# Patient Record
Sex: Male | Born: 1990 | Race: Black or African American | Hispanic: No | Marital: Single | Smoking: Former smoker
Health system: Southern US, Community
[De-identification: ages and names within clinical notes are randomized; demographics above are authoritative.]

## PROBLEM LIST (undated history)

## (undated) HISTORY — PX: HERNIA REPAIR: SHX51

## (undated) HISTORY — PX: ANKLE SURGERY: SHX546

---
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
## 2010-07-08 ENCOUNTER — Emergency Department (HOSPITAL_COMMUNITY): Payer: Self-pay

## 2010-07-08 ENCOUNTER — Emergency Department (HOSPITAL_COMMUNITY)
Admission: EM | Admit: 2010-07-08 | Discharge: 2010-07-09 | Disposition: A | Discharge status: Home / Self Care | Payer: No Typology Code available for payment source | Attending: General Surgery | Admitting: General Surgery

## 2010-07-08 DIAGNOSIS — M79609 Pain in unspecified limb: Secondary | ICD-10-CM | POA: Insufficient documentation

## 2010-07-08 DIAGNOSIS — M25519 Pain in unspecified shoulder: Secondary | ICD-10-CM | POA: Insufficient documentation

## 2010-07-08 DIAGNOSIS — S51809A Unspecified open wound of unspecified forearm, initial encounter: Secondary | ICD-10-CM | POA: Insufficient documentation

## 2010-07-08 DIAGNOSIS — IMO0002 Reserved for concepts with insufficient information to code with codable children: Secondary | ICD-10-CM | POA: Insufficient documentation

## 2010-07-08 DIAGNOSIS — M25529 Pain in unspecified elbow: Secondary | ICD-10-CM | POA: Insufficient documentation
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
## 2013-05-21 ENCOUNTER — Emergency Department (HOSPITAL_COMMUNITY)
Admission: EM | Admit: 2013-05-21 | Discharge: 2013-05-22 | Disposition: A | Discharge status: Home / Self Care | Payer: No Typology Code available for payment source | Attending: Emergency Medicine | Admitting: Emergency Medicine

## 2013-05-21 ENCOUNTER — Encounter (HOSPITAL_COMMUNITY): Payer: Self-pay | Admitting: Emergency Medicine

## 2013-05-21 DIAGNOSIS — Z87891 Personal history of nicotine dependence: Secondary | ICD-10-CM | POA: Insufficient documentation

## 2013-05-21 DIAGNOSIS — S61409A Unspecified open wound of unspecified hand, initial encounter: Secondary | ICD-10-CM | POA: Insufficient documentation

## 2013-05-21 DIAGNOSIS — Z23 Encounter for immunization: Secondary | ICD-10-CM | POA: Insufficient documentation

## 2013-05-21 DIAGNOSIS — S61411A Laceration without foreign body of right hand, initial encounter: Secondary | ICD-10-CM
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
## 2013-05-22 MED ORDER — TETANUS-DIPHTH-ACELL PERTUSSIS 5-2.5-18.5 LF-MCG/0.5 IM SUSP
0.5000 mL | Freq: Once | INTRAMUSCULAR | Status: AC
  Administered 2013-05-22: 0.5 mL via INTRAMUSCULAR

## 2013-05-22 NOTE — ED Provider Notes (Signed)
Medical screening examination/treatment/procedure(s) were performed by non-physician practitioner and as supervising physician I was immediately available for consultation/collaboration.   Bernal Luhman, MD 05/22/13 0722 

## 2013-05-22 NOTE — Discharge Instructions (Signed)
1. Medications: usual home medications 2. Treatment: rest, drink plenty of fluids, keep wound clean and bandage dry 3. Follow Up: Please followup with your primary doctor for discussion of your diagnoses and further evaluation after today's visit; if you do not have a primary care doctor use the resource guide provided to find one;     Laceration Care, Adult A laceration is a cut or lesion that goes through all layers of the skin and into the tissue just beneath the skin. TREATMENT  Some lacerations may not require closure. Some lacerations may not be able to be closed due to an increased risk of infection. It is important to see your caregiver as soon as possible after an injury to minimize the risk of infection and maximize the opportunity for successful closure. If closure is appropriate, pain medicines may be given, if needed. The wound will be cleaned to help prevent infection. Your caregiver will use stitches (sutures), staples, wound glue (adhesive), or skin adhesive strips to repair the laceration. These tools bring the skin edges together to allow for faster healing and a better cosmetic outcome. However, all wounds will heal with a scar. Once the wound has healed, scarring can be minimized by covering the wound with sunscreen during the day for 1 full year. HOME CARE INSTRUCTIONS  For sutures or staples:  Keep the wound clean and dry.  If you were given a bandage (dressing), you should change it at least once a day. Also, change the dressing if it becomes wet or dirty, or as directed by your caregiver.  Wash the wound with soap and water 2 times a day. Rinse the wound off with water to remove all soap. Pat the wound dry with a clean towel.  After cleaning, apply a thin layer of the antibiotic ointment as recommended by your caregiver. This will help prevent infection and keep the dressing from sticking.  You may shower as usual after the first 24 hours. Do not soak the wound in water  until the sutures are removed.  Only take over-the-counter or prescription medicines for pain, discomfort, or fever as directed by your caregiver.  Get your sutures or staples removed as directed by your caregiver. For skin adhesive strips:  Keep the wound clean and dry.  Do not get the skin adhesive strips wet. You may bathe carefully, using caution to keep the wound dry.  If the wound gets wet, pat it dry with a clean towel.  Skin adhesive strips will fall off on their own. You may trim the strips as the wound heals. Do not remove skin adhesive strips that are still stuck to the wound. They will fall off in time. For wound adhesive:  You may briefly wet your wound in the shower or bath. Do not soak or scrub the wound. Do not swim. Avoid periods of heavy perspiration until the skin adhesive has fallen off on its own. After showering or bathing, gently pat the wound dry with a clean towel.  Do not apply liquid medicine, cream medicine, or ointment medicine to your wound while the skin adhesive is in place. This may loosen the film before your wound is healed.  If a dressing is placed over the wound, be careful not to apply tape directly over the skin adhesive. This may cause the adhesive to be pulled off before the wound is healed.  Avoid prolonged exposure to sunlight or tanning lamps while the skin adhesive is in place. Exposure to ultraviolet light in  the first year will darken the scar.  The skin adhesive will usually remain in place for 5 to 10 days, then naturally fall off the skin. Do not pick at the adhesive film. You may need a tetanus shot if:  You cannot remember when you had your last tetanus shot.  You have never had a tetanus shot. If you get a tetanus shot, your arm may swell, get red, and feel warm to the touch. This is common and not a problem. If you need a tetanus shot and you choose not to have one, there is a rare chance of getting tetanus. Sickness from tetanus can  be serious. SEEK MEDICAL CARE IF:   You have redness, swelling, or increasing pain in the wound.  You see a red line that goes away from the wound.  You have yellowish-white fluid (pus) coming from the wound.  You have a fever.  You notice a bad smell coming from the wound or dressing.  Your wound breaks open before or after sutures have been removed.  You notice something coming out of the wound such as wood or glass.  Your wound is on your hand or foot and you cannot move a finger or toe. SEEK IMMEDIATE MEDICAL CARE IF:   Your pain is not controlled with prescribed medicine.  You have severe swelling around the wound causing pain and numbness or a change in color in your arm, hand, leg, or foot.  Your wound splits open and starts bleeding.  You have worsening numbness, weakness, or loss of function of any joint around or beyond the wound.  You develop painful lumps near the wound or on the skin anywhere on your body. MAKE SURE YOU:   Understand these instructions.  Will watch your condition.  Will get help right away if you are not doing well or get worse. Document Released: 12/31/2004 Document Revised: 03/25/2011 Document Reviewed: 06/26/2010 [facility] Patient Information 2014 [place], [place].    Emergency Department Resource Guide 1) Find a Doctor and Pay Out of Pocket Although you won't have to find out who is covered by your insurance plan, it is a good idea to ask around and get recommendations. You will then need to call the office and see if the doctor you have chosen will accept you as a new patient and what types of options they offer for patients who are self-pay. Some doctors offer discounts or will set up payment plans for their patients who do not have insurance, but you will need to ask so you aren't surprised when you get to your appointment.  2) Contact Your Local Health Department Not all health departments have doctors that can see patients for sick  visits, but many do, so it is worth a call to see if yours does. If you don't know where your local health department is, you can check in your phone book. The CDC also has a tool to help you locate your state's health department, and many state websites also have listings of all of their local health departments.  3) Find a Walk-in Clinic If your illness is not likely to be very severe or complicated, you may want to try a walk in clinic. These are popping up all over the country in pharmacies, drugstores, and shopping centers. They're usually staffed by nurse practitioners or physician assistants that have been trained to treat common illnesses and complaints. They're usually fairly quick and inexpensive. However, if you have serious medical issues or chronic  medical problems, these are probably not your best option.  No Primary Care Doctor: - Call Health Connect at  [phone] - they can help you locate a primary care doctor that  accepts your insurance, provides certain services, etc. - Physician Referral Service- [phone]  Chronic Pain Problems: Organization         Address  Phone   Notes  [facility]  [phone] Patients need to be referred by their primary care doctor.   Medication Assistance: Organization         Address  Phone   Notes  [facility] Medication [facility] [address] [place] [place], [place] [address] [phone] --Must be a resident of [facility] -- Must have NO insurance coverage whatsoever (no Medicaid/ Medicare, etc.) -- The pt. MUST have a primary care doctor that directs their care regularly and follows them in the community   [facility]  [phone]   [employer]  [phone]    Agencies that provide inexpensive medical care: Organization         Address  Phone   Notes  [facility]  [phone]   [facility]    [phone]   [facility] [address] [place], [place] [address] [phone]   [facility] [address] [place], [place] [phone]   Planned Parenthood    [phone]   [facility]    [phone]   [facility]  [address], [place] Phone:  [phone], Fax:  [phone] Hours of Operation:  9 am - 6 pm, M-F.  Also accepts Medicaid/Medicare and self-pay.  [facility]  [address], [place] Phone: [phone], Fax: [phone]. Hours of Operation:  8:30 am - 5:30 pm, M-F.  Also accepts Medicaid and self-pay.  [facility] [place] [address] [place] Phone: [phone]   [facility] [address] Natasha Bence [place], [place] [phone], Ext. [phone] Mondays & Thursdays: 7-9 AM.  First 15 patients are seen on a first come, first serve basis.    Medicaid-accepting [facility] Providers:  Organization         Address  Phone   Notes  [facility] [address], [place] [phone] Also accepts self-pay patients.  [facility] [address] Laurell Josephs [place], [place]  [phone]   [facility] [address], [place] [phone]   [facility] [address] [place] [phone]   Renaye Rakers [address], [place]   [phone] Only accepts [place] Access [place] patients after they have their name applied to their card.   Self-Pay (no insurance) in [facility]:  Organization         Address  Phone   Notes  Sickle Cell Patients, [facility] [address] [place], [place] [phone]   [facility] [address] [place], [place] [phone]   [facility] [place]  [address], [place] [phone]   [facility]/Dr. Osei-Bonsu  [address],  [place] or [address], [place] [phone] Phone number for both [place] and [place] locations is the same.  Urgent  Medical [facility] [address], [place] [phone]   [facility] [place] [address] [place] [address], [place] or [address] [phone]   [facility] [address], [place] [phone], phone; [phone], fax Sees patients 1st and 3rd Saturday of every month.  Must not qualify for public or private insurance (i.e. Medicaid, Medicare, [place] Health Choice, Veterans' Benefits)  Household income should be no more than 200% of the poverty level The clinic cannot treat you if you are pregnant or think you are pregnant  Sexually transmitted diseases are not treated at the clinic.    Dental Care: Organization         Address  Phone  Notes  [facility] [address], [place] [phone] Accepts children up to age 23 who are enrolled in [place] or [place] Health Choice; pregnant women with a Medicaid card; and children who have applied for Medicaid or [place] Health Choice, but were declined, whose parents can pay a reduced fee at time of service.  [facility] [place]  [address], [place] [phone] Accepts children up to age 23 who are enrolled in [place] or [place] Health Choice; pregnant women with a Medicaid card; and children who have applied for Medicaid or [place] Health Choice, but were declined, whose parents can pay a reduced fee at time of service.  [facility]  [address], [place] [phone] Patients are seen by appointment only. Walk-ins are not accepted. [facility] will see patients 23 years of age and older. Monday - Tuesday (8am-5pm) Most Wednesdays (8:30-5pm) $30 per visit, cash only  [facility]  [address], [facility] [place] [phone] Patients are seen by appointment only. Walk-ins are not accepted. [facility] will see patients 23 years of age and older. One Wednesday Evening (Monthly: Volunteer Based).  $30 per visit, cash only  [employer] [facility] [facility]  [phone] for adults; Children under age 234, call Graduate Pediatric Dentistry at [phone]. Children aged 454-14, please call [phone] to request a pediatric application.  Dental services are provided in all areas of dental care including fillings, crowns and bridges, complete and partial dentures, implants, gum treatment, root canals, and extractions. Preventive care is also provided. Treatment is provided to both adults and children. Patients are selected via a lottery and there is often a waiting list.   [facility] [address], [place]  [phone] [email or web address]   [facility] [address], [place], [place] [phone], Ext. [phone] Second and Fourth Thursday of each month, opens at 6:30 AM; Clinic ends at 9 AM.  Patients are seen on a first-come first-served basis, and a limited number are seen during each clinic.   [facility]  [address], [place], [place] [phone]   Eligibility Requirements You must have lived in [place], [place], or [place] counties for at least the last three months.   You cannot be eligible for state or federal sponsored National Cityhealthcare insurance, including CIGNAVeterans Administration, [place]icaid, or [employer]icare.   You generally cannot be eligible for healthcare insurance through your employer.    How to apply: Eligibility screenings are held every Tuesday and Wednesday afternoon from 1:00 pm until 4:00 pm. You do not need an appointment for the interview!  [place]  [facility] [address] [place], [place] [phone]   [facility] Health Department  [phone]   [facility] Health Department  [phone]   [facility] Health Department  [phone]    Behavioral Health Resources in the Community: Intensive Outpatient Programs Organization         Address  Phone  Notes  [facility] Services [address], [place], [place] [phone]   [facility] Outpatient [address] [place], [place] [phone]   [facility] [address], [place], [place]  [phone]   [facility] [address]  [place], [place] [phone] or [phone]   Substance Abuse Resources Organization         Address  Phone  Notes  [facility]  [phone]   [facility]  [phone]   [facility] [place]  [phone]   Floydene Flock  [phone]   Residential & Outpatient Substance Abuse Program  [phone]   Psychological Services Organization         Address  Phone  Notes  [facility] Behavioral Health  [phone]   [facility] Services  [phone]   [facility] Mental Health [address] [place] [phone] or [phone]    Mobile Crisis Teams Organization         Address  Phone  Notes  [facility], Mobile Crisis Care Unit  [phone]   [facility]  [address] [place], [place] [phone]   Doristine Locks [address] [place] [phone]    Self-Help/Support Groups Organization         Address  Phone             Notes  [facility] [place] - variety of support groups  336- I7437963 Call for more information  Narcotics Anonymous (NA), [facility] [address], [employer] [place]  2 meetings at this location   Statistician         Address  Phone  Notes  [facility] [address],    [place]  [phone]   [facility]  [address], [place] [address], [place], [place] [phone]   [facility] [address] [place], [place]  [phone] Admissions: 8am-3pm M-F  [facility] [address],    [place], [place] [phone]   [facility] [address] [place], [place], [place] [phone]   [facility] [address],  [place], [place] [phone]   [facility] - Intensive Outpatient [address] Laurell Josephs [address], [place], [place] [phone]   [facility] [address]  [place], [place] [phone] or [phone]   [facility] ([facility]) [address], [place], [place] [phone] Accepts Medicaid  [facility] [address]  [place] [phone] Substance Abuse/Addiction Treatment   [facility] Organization         Address  Phone  Notes  [facility]  [phone]   Angie Fava, PhD [address] Ervin Knack [place], [place]   [phone] or [phone]   [facility]al   [address] [place], [place] [phone]   [facility] [address] [place], [place] [phone] Insurance/Medicaid/sponsorship through [employer] and Families [address]  [place], [place] [phone] [place], [place] [phone]    Dr. Adele Schilder  [phone]   [facility] Dept. 1) [address], [place] 2) [place] 3)  [address], [place] [phone]  [phone]   [place] [phone] or [phone] (After Hours)

## 2013-10-23 ENCOUNTER — Emergency Department (HOSPITAL_COMMUNITY)
Admission: EM | Admit: 2013-10-23 | Discharge: 2013-10-23 | Discharge status: Against Medical Advice | Payer: No Typology Code available for payment source | Attending: Emergency Medicine | Admitting: Emergency Medicine

## 2013-10-23 ENCOUNTER — Encounter (HOSPITAL_COMMUNITY): Payer: Self-pay | Admitting: Emergency Medicine

## 2013-10-23 DIAGNOSIS — R1110 Vomiting, unspecified: Secondary | ICD-10-CM | POA: Insufficient documentation

## 2013-10-23 LAB — COMPREHENSIVE METABOLIC PANEL
ALT: 35 U/L (ref 0–53)
ANION GAP: 23 — AB (ref 5–15)
AST: 45 U/L — ABNORMAL HIGH (ref 0–37)
Albumin: 4.8 g/dL (ref 3.5–5.2)
Alkaline Phosphatase: 52 U/L (ref 39–117)
BUN: 20 mg/dL (ref 6–23)
CALCIUM: 9.8 mg/dL (ref 8.4–10.5)
CO2: 19 mEq/L (ref 19–32)
Chloride: 97 mEq/L (ref 96–112)
Creatinine, Ser: 0.96 mg/dL (ref 0.50–1.35)
GFR calc non Af Amer: >90 mL/min (ref >90)
GLUCOSE: 113 mg/dL — AB (ref 70–99)
Potassium: 4.2 mEq/L (ref 3.7–5.3)
SODIUM: 139 meq/L (ref 137–147)
TOTAL PROTEIN: 8.4 g/dL — AB (ref 6.0–8.3)
Total Bilirubin: 1.1 mg/dL (ref 0.3–1.2)

## 2013-10-23 LAB — CBC WITH DIFFERENTIAL/PLATELET
Basophils Absolute: 0 10*3/uL (ref 0.0–0.1)
Basophils Relative: 0 % (ref 0–1)
EOS ABS: 0 10*3/uL (ref 0.0–0.7)
EOS PCT: 0 % (ref 0–5)
HCT: 44.9 % (ref 39.0–52.0)
Hemoglobin: 16.2 g/dL (ref 13.0–17.0)
LYMPHS ABS: 1.1 10*3/uL (ref 0.7–4.0)
Lymphocytes Relative: 7 % — ABNORMAL LOW (ref 12–46)
MCH: 28.9 pg (ref 26.0–34.0)
MCHC: 36.1 g/dL — AB (ref 30.0–36.0)
MCV: 80 fL (ref 78.0–100.0)
Monocytes Absolute: 0.4 10*3/uL (ref 0.1–1.0)
Monocytes Relative: 3 % (ref 3–12)
Neutro Abs: 15.4 10*3/uL — ABNORMAL HIGH (ref 1.7–7.7)
Neutrophils Relative %: 90 % — ABNORMAL HIGH (ref 43–77)
PLATELETS: 236 10*3/uL (ref 150–400)
RBC: 5.61 MIL/uL (ref 4.22–5.81)
RDW: 13.4 % (ref 11.5–15.5)
WBC: 17 10*3/uL — ABNORMAL HIGH (ref 4.0–10.5)
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
## 2014-03-06 ENCOUNTER — Emergency Department (HOSPITAL_COMMUNITY): Payer: Self-pay

## 2014-03-06 ENCOUNTER — Encounter (HOSPITAL_COMMUNITY): Payer: Self-pay | Admitting: Emergency Medicine

## 2014-03-06 ENCOUNTER — Emergency Department (HOSPITAL_COMMUNITY)
Admission: EM | Admit: 2014-03-06 | Discharge: 2014-03-06 | Disposition: A | Discharge status: Home / Self Care | Payer: Self-pay | Attending: Emergency Medicine | Admitting: Emergency Medicine

## 2014-03-06 DIAGNOSIS — Y9289 Other specified places as the place of occurrence of the external cause: Secondary | ICD-10-CM | POA: Insufficient documentation

## 2014-03-06 DIAGNOSIS — W208XXA Other cause of strike by thrown, projected or falling object, initial encounter: Secondary | ICD-10-CM | POA: Insufficient documentation

## 2014-03-06 DIAGNOSIS — IMO0002 Reserved for concepts with insufficient information to code with codable children: Secondary | ICD-10-CM

## 2014-03-06 DIAGNOSIS — Y990 Civilian activity done for income or pay: Secondary | ICD-10-CM | POA: Insufficient documentation

## 2014-03-06 DIAGNOSIS — Y9389 Activity, other specified: Secondary | ICD-10-CM | POA: Insufficient documentation

## 2014-03-06 DIAGNOSIS — Z87891 Personal history of nicotine dependence: Secondary | ICD-10-CM | POA: Insufficient documentation

## 2014-03-06 DIAGNOSIS — S61212A Laceration without foreign body of right middle finger without damage to nail, initial encounter: Secondary | ICD-10-CM | POA: Insufficient documentation

## 2014-03-06 MED ORDER — LIDOCAINE HCL (PF) 1 % IJ SOLN
5.0000 mL | Freq: Once | INTRAMUSCULAR | Status: AC
  Administered 2014-03-06: 5 mL via INTRADERMAL
  Filled 2014-03-06: qty 5

## 2014-03-06 MED ORDER — OXYCODONE HCL 5 MG PO TABS
5.0000 mg | ORAL_TABLET | ORAL | Status: AC
  Administered 2014-03-06: 5 mg via ORAL
  Filled 2014-03-06: qty 1

## 2014-03-06 MED ORDER — OXYCODONE HCL 5 MG PO TABS: 5.0000 mg | ORAL_TABLET | ORAL | Status: AC | PRN

## 2014-03-06 NOTE — ED Notes (Signed)
PA at bedside.

## 2014-03-06 NOTE — Discharge Instructions (Signed)
Please follow the directions provided.  Be sure to return in 7 days for suture removal.  Keep your wound clean and dry.  Change your dressing daily using antibiotic ointment.  Return sooner for any signs of infection including redness, swelling, warmth or drainage.  Don't hesitate to return for any new, worsening or concerning symptoms.      SEEK IMMEDIATE MEDICAL CARE IF:  You have severe swelling around the wound causing pain and numbness or a change in color in your arm, hand, leg, or foot.  Your wound splits open and starts bleeding.  You have worsening numbness, weakness, or loss of function of any joint around or beyond the wound.  You develop painful lumps near the wound or on the skin anywhere on your body.

## 2014-03-06 NOTE — ED Provider Notes (Signed)
CSN: [id number]     Arrival date & time 03/06/14  1033 History   First MD Initiated Contact with Patient 03/06/14 1042     Chief Complaint  Patient presents with  . Laceration   (Consider location/radiation/quality/duration/timing/severity/associated sxs/prior Treatment) HPI  Joe Reeves is a 24 yo male presenting with injury to his right middle finger.  He states he was at work and dropped a Dispensing optician on it appr 30 min PTA, resulting in a laceration around his finger.  He estimates the board weighs about 100lb.  He describes the pain as aching and rates it as 9/10. He denies any difficulty moving the finger or any altered sensation. His tetanus shot is UTD.    History reviewed. No pertinent past medical history. Past Surgical History  Procedure Laterality Date  . Hernia repair     No family history on file. History  Substance Use Topics  . Smoking status: Former Games developer  . Smokeless tobacco: Not on file  . Alcohol Use: Yes    Review of Systems  Musculoskeletal: Positive for myalgias and arthralgias.  Skin: Positive for wound. Negative for color change.  Neurological: Negative for weakness and numbness.    Allergies  Tylenol  Home Medications   Prior to Admission medications   Not on File   BP 132/78 mmHg  Pulse 70  Temp(Src) 98 F (36.7 C) (Oral)  Resp 16  Ht  (1.778 m)  Wt 180 lb (81.647 kg)  BMI 25.83 kg/m2  SpO2 99% Physical Exam  Constitutional: He appears well-developed and well-nourished. No distress.  Eyes: Conjunctivae are normal.  Cardiovascular: Normal rate.   Pulmonary/Chest: Effort normal.  Musculoskeletal: He exhibits tenderness.       Right hand: He exhibits tenderness and laceration. He exhibits normal range of motion, normal two-point discrimination and normal capillary refill. Normal sensation noted. Normal strength noted.       Hands: 5/5 strength with flexion and extension, bleeding controlled  Neurological: He is alert.    Skin: Skin is warm. He is not diaphoretic.  Nursing note and vitals reviewed.   ED Course  Procedures (including critical care time) LACERATION REPAIR Performed by: Harle Battiest Authorized by: Harle Battiest Consent: Verbal consent obtained. Risks and benefits: risks, benefits and alternatives were discussed Consent given by: patient Patient identity confirmed: provided demographic data Prepped and Draped in normal sterile fashion Wound explored  Laceration Location: right middle finger, dorsal  Laceration Length: 2 cm  No Foreign Bodies seen or palpated  Anesthesia: local infiltration  Local anesthetic: lidocaine 1 % without epinephrine  Anesthetic total: 2 ml  Irrigation method: syringe Amount of cleaning: standard  Skin closure: 5-0 Prolene  Number of sutures: 4  Technique: simple interrupted  Patient tolerance: Patient tolerated the procedure well with no immediate complications.  LACERATION REPAIR Performed by: Harle Battiest Authorized by: Harle Battiest Consent: Verbal consent obtained. Risks and benefits: risks, benefits and alternatives were discussed Consent given by: patient Patient identity confirmed: provided demographic data Prepped and Draped in normal sterile fashion Wound explored  Laceration Location: right middle finger, palmar   Laceration Length: 2 cm  No Foreign Bodies seen or palpated  Anesthesia: local infiltration  Local anesthetic: lidocaine 1 % without epinephrine  Anesthetic total: 2 ml  Irrigation method: syringe Amount of cleaning: standard  Skin closure: 5-0 prolene  Number of sutures: 2  Technique: simple interrupted  Patient tolerance: Patient tolerated the procedure well with no immediate complications.    Labs  Review Labs Reviewed - No data to display  Imaging Review Dg Finger Middle Right  03/06/2014   CLINICAL DATA:  Right middle finger injury, injury, laceration  EXAM: RIGHT  MIDDLE FINGER 2+V  COMPARISON:  None.  FINDINGS: Soft tissue injury noted compatible with laceration. Mild swelling. Normal alignment without acute fracture. Preserved joint spaces. No significant arthropathy. No radiopaque foreign body.  IMPRESSION: Soft tissue injury/ laceration.  No acute osseous finding.   Electronically Signed   By: Judie PetitM.  Shick M.D.   On: 03/06/2014 11:34     EKG Interpretation None      MDM   Final diagnoses:  Laceration   24 yo with laceration that occurred less than 2 hours PTA. Pressure irrigation performed. Tdap is UTD, X-ray is negative for fracture and foreign body. Repair performed and wound care discussed with pt.  Disscussed suture home care w pt and answered questions. Instructed pt to return for suture removal in 7 days. Pt is well-appearing, in no acute distress and vital signs reviewed and not concerning.  They appear safe to be discharged.  Return precautions provided. Pt aware of plan and in agreement.      Filed Vitals:   03/06/14 1046 03/06/14 1136 03/06/14 1137 03/06/14 1233  BP:  128/75  122/77  Pulse:   70 65  Temp:      TempSrc:      Resp:  16  16  Height: 5\' 10"  (1.778 m)     Weight: 180 lb (81.647 kg)     SpO2:   100% 99%   Meds given in ED:  Medications  lidocaine (PF) (XYLOCAINE) 1 % injection 5 mL (5 mLs Intradermal Given by Other 03/06/14 1138)  oxyCODONE (Oxy IR/ROXICODONE) immediate release tablet 5 mg (5 mg Oral Given 03/06/14 1138)    Discharge Medication List as of 03/06/2014 12:26 PM    START taking these medications   Details  oxyCODONE (ROXICODONE) 5 MG immediate release tablet Take 1 tablet (5 mg total) by mouth every 4 (four) hours as needed for severe pain., Starting 03/06/2014, Until Discontinued, Print          Harle BattiestElizabeth Daleena Rotter, NP 03/07/14 1040  Suzi RootsKevin E Steinl, MD 03/11/14 [phone]

## 2014-03-06 NOTE — ED Notes (Signed)
Pt c/o lifting a scaffold board and it fell onto his right middle finger. Bleeding controlled at this time.

## 2014-03-06 NOTE — ED Notes (Signed)
Patient transported to X-ray 

## 2014-03-06 NOTE — ED Notes (Signed)
Wound care performed to R middle finger. Covered with sterile dressing and coban. Finger splint applied. Wound care instructions discussed with patient. Has no further questions at the time. Instructed to follow up in ED to have sutures removed in 5 days.

## 2014-03-06 NOTE — ED Notes (Signed)
PA Tysinger at bedside.

## 2014-07-14 ENCOUNTER — Emergency Department (HOSPITAL_COMMUNITY)
Admission: EM | Admit: 2014-07-14 | Discharge: 2014-07-14 | Disposition: A | Discharge status: Home / Self Care | Payer: Self-pay | Attending: Emergency Medicine | Admitting: Emergency Medicine

## 2014-07-14 ENCOUNTER — Encounter (HOSPITAL_COMMUNITY): Payer: Self-pay | Admitting: Emergency Medicine

## 2014-07-14 DIAGNOSIS — H6642 Suppurative otitis media, unspecified, left ear: Secondary | ICD-10-CM

## 2014-07-14 DIAGNOSIS — H66002 Acute suppurative otitis media without spontaneous rupture of ear drum, left ear: Secondary | ICD-10-CM | POA: Insufficient documentation

## 2014-07-14 DIAGNOSIS — Z87891 Personal history of nicotine dependence: Secondary | ICD-10-CM | POA: Insufficient documentation

## 2014-07-14 MED ORDER — AMOXICILLIN 500 MG PO CAPS: 500.0000 mg | ORAL_CAPSULE | Freq: Three times a day (TID) | ORAL | Status: AC

## 2014-07-14 MED ORDER — IBUPROFEN 600 MG PO TABS
600.0000 mg | ORAL_TABLET | Freq: Four times a day (QID) | ORAL | Status: DC | PRN
Start: 2014-07-14 — End: 2015-05-10

## 2014-07-14 NOTE — ED Notes (Signed)
Patient states started having L ear pain x 1 day ago.   Patient denies other symptoms.

## 2014-07-14 NOTE — Discharge Instructions (Signed)
Take Sudafed for congestion, and he can obtain this medication over-the-counter. Take ibuprofen or Tylenol for pain. Amoxicillin as prescribed until all gone for the infection. Follow-up with primary care doctor or return if symptoms are worsening   Otitis Media Otitis media is redness, soreness, and inflammation of the middle ear. Otitis media may be caused by allergies or, most commonly, by infection. Often it occurs as a complication of the common cold. SIGNS AND SYMPTOMS Symptoms of otitis media may include:  Earache.  Fever.  Ringing in your ear.  Headache.  Leakage of fluid from the ear. DIAGNOSIS To diagnose otitis media, your health care provider will examine your ear with an otoscope. This is an instrument that allows your health care provider to see into your ear in order to examine your eardrum. Your health care provider also will ask you questions about your symptoms. TREATMENT  Typically, otitis media resolves on its own within 3-5 days. Your health care provider may prescribe medicine to ease your symptoms of pain. If otitis media does not resolve within 5 days or is recurrent, your health care provider may prescribe antibiotic medicines if he or she suspects that a bacterial infection is the cause. HOME CARE INSTRUCTIONS   If you were prescribed an antibiotic medicine, finish it all even if you start to feel better.  Take medicines only as directed by your health care provider.  Keep all follow-up visits as directed by your health care provider. SEEK MEDICAL CARE IF:  You have otitis media only in one ear, or bleeding from your nose, or both.  You notice a lump on your neck.  You are not getting better in 3-5 days.  You feel worse instead of better. SEEK IMMEDIATE MEDICAL CARE IF:   You have pain that is not controlled with medicine.  You have swelling, redness, or pain around your ear or stiffness in your neck.  You notice that part of your face is  paralyzed.  You notice that the bone behind your ear (mastoid) is tender when you touch it. MAKE SURE YOU:   Understand these instructions.  Will watch your condition.  Will get help right away if you are not doing well or get worse. Document Released: 10/06/2003 Document Revised: 05/17/2013 Document Reviewed: 07/28/2012 [facility] Patient Information 2015 [employer], [place]. This information is not intended to replace advice given to you by your health care provider. Make sure you discuss any questions you have with your health care provider.

## 2014-07-14 NOTE — ED Provider Notes (Signed)
CSN: [id number]     Arrival date & time 07/14/14  1058 History  This chart was scribed for non-physician practitioner Jaynie Crumble, PA-C working with Purvis Sheffield, MD by Littie Deeds, ED Scribe. This patient was seen in room TR05C/TR05C and the patient's care was started at 11:42 AM.      Chief Complaint  Patient presents with  . Otalgia   The history is provided by the patient. No language interpreter was used.    HPI Comments: Joe Reeves is a 24 y.o. male who presents to the Emergency Department complaining of gradual onset left ear pain that started yesterday. Patient has not tried anything for the pain. He states he is still able to hear. He denies ear drainage, sore throat, rhinorrhea, fever, and cough. He denies having medical problems.   History reviewed. No pertinent past medical history. Past Surgical History  Procedure Laterality Date  . Hernia repair    . Ankle surgery     No family history on file. History  Substance Use Topics  . Smoking status: Former Games developer  . Smokeless tobacco: Not on file  . Alcohol Use: Yes    Review of Systems  Constitutional: Negative for fever.  HENT: Positive for ear pain. Negative for ear discharge, hearing loss and rhinorrhea.   Respiratory: Negative for cough.       Allergies  Tylenol  Home Medications   Prior to Admission medications   Medication Sig Start Date End Date Taking? Authorizing Provider  oxyCODONE (ROXICODONE) 5 MG immediate release tablet Take 1 tablet (5 mg total) by mouth every 4 (four) hours as needed for severe pain. 03/06/14   Harle Battiest, NP   BP 124/72 mmHg  Pulse 60  Temp(Src) 98.7 F (37.1 C) (Oral)  Resp 14  Ht  (1.778 m)  Wt 196 lb 6.4 oz (89.086 kg)  BMI 28.18 kg/m2  SpO2 96% Physical Exam  Constitutional: He is oriented to person, place, and time. He appears well-developed and well-nourished. No distress.  HENT:  Head: Normocephalic and atraumatic.  Right Ear:  Tympanic membrane, external ear and ear canal normal.  Left Ear: External ear and ear canal normal. Tympanic membrane is erythematous and bulging. A middle ear effusion is present.  Nose: Nose normal.  Mouth/Throat: Uvula is midline and oropharynx is clear and moist. No oropharyngeal exudate.  Eyes: Pupils are equal, round, and reactive to light.  Neck: Neck supple.  Cardiovascular: Normal rate, regular rhythm and normal heart sounds.   Pulmonary/Chest: Effort normal and breath sounds normal. No respiratory distress. He has no wheezes. He has no rales.  Musculoskeletal: He exhibits no edema.  Lymphadenopathy:    He has no cervical adenopathy.  Neurological: He is alert and oriented to person, place, and time. No cranial nerve deficit.  Skin: Skin is warm and dry. No rash noted.  Psychiatric: He has a normal mood and affect. His behavior is normal.  Nursing note and vitals reviewed.   ED Course  Procedures  DIAGNOSTIC STUDIES: Oxygen Saturation is 96% on room air, adequate by my interpretation.    COORDINATION OF CARE: 11:45 AM-Discussed treatment plan which includes ibuprofen and amoxicillin with patient/guardian at bedside and patient/guardian agreed to plan. Patient was instructed to take OTC Sudafed.   Labs Review Labs Reviewed - No data to display  Imaging Review No results found.   EKG Interpretation None      MDM   Final diagnoses:  Suppurative otitis media of left ear, unspecified  chronicity, unspecified otitis media location    patient is an emergency department for left ear pain. Exam is consistent with otitis media. Will place amoxicillin, ibuprofen for pain. Follow-up with primary care doctor.  Filed Vitals:   07/14/14 1102  BP: 124/72  Pulse: 60  Temp: 98.7 F (37.1 C)  TempSrc: Oral  Resp: 14  Height: 5\' 10"  (1.778 m)  Weight: 196 lb 6.4 oz (89.086 kg)  SpO2: 96%    I personally performed the services described in this documentation, which was  scribed in my presence. The recorded information has been reviewed and is accurate.     Jaynie Crumbleatyana Yaritzi Craun, PA-C 07/14/14 1441  Purvis SheffieldForrest Harrison, MD 07/14/14 1558

## 2015-05-10 ENCOUNTER — Emergency Department (HOSPITAL_COMMUNITY): Payer: No Typology Code available for payment source

## 2015-05-10 ENCOUNTER — Emergency Department (HOSPITAL_COMMUNITY)
Admission: EM | Admit: 2015-05-10 | Discharge: 2015-05-10 | Disposition: A | Discharge status: Home / Self Care | Payer: No Typology Code available for payment source | Attending: Emergency Medicine | Admitting: Emergency Medicine

## 2015-05-10 ENCOUNTER — Emergency Department (HOSPITAL_COMMUNITY): Payer: Self-pay

## 2015-05-10 ENCOUNTER — Encounter (HOSPITAL_COMMUNITY): Payer: Self-pay | Admitting: Emergency Medicine

## 2015-05-10 DIAGNOSIS — S0081XA Abrasion of other part of head, initial encounter: Secondary | ICD-10-CM

## 2015-05-10 DIAGNOSIS — Y9289 Other specified places as the place of occurrence of the external cause: Secondary | ICD-10-CM | POA: Insufficient documentation

## 2015-05-10 DIAGNOSIS — S0990XA Unspecified injury of head, initial encounter: Secondary | ICD-10-CM | POA: Insufficient documentation

## 2015-05-10 DIAGNOSIS — S6992XA Unspecified injury of left wrist, hand and finger(s), initial encounter: Secondary | ICD-10-CM | POA: Insufficient documentation

## 2015-05-10 DIAGNOSIS — S6991XA Unspecified injury of right wrist, hand and finger(s), initial encounter: Secondary | ICD-10-CM | POA: Insufficient documentation

## 2015-05-10 DIAGNOSIS — Y9389 Activity, other specified: Secondary | ICD-10-CM | POA: Insufficient documentation

## 2015-05-10 DIAGNOSIS — Z87891 Personal history of nicotine dependence: Secondary | ICD-10-CM | POA: Insufficient documentation

## 2015-05-10 DIAGNOSIS — T1490XA Injury, unspecified, initial encounter: Secondary | ICD-10-CM

## 2015-05-10 DIAGNOSIS — Z23 Encounter for immunization: Secondary | ICD-10-CM | POA: Insufficient documentation

## 2015-05-10 DIAGNOSIS — S0083XA Contusion of other part of head, initial encounter: Secondary | ICD-10-CM

## 2015-05-10 DIAGNOSIS — Y998 Other external cause status: Secondary | ICD-10-CM | POA: Insufficient documentation

## 2015-05-10 MED ORDER — IBUPROFEN 800 MG PO TABS: 800.0000 mg | ORAL_TABLET | Freq: Three times a day (TID) | ORAL | Status: DC

## 2015-05-10 MED ORDER — TETANUS-DIPHTH-ACELL PERTUSSIS 5-2.5-18.5 LF-MCG/0.5 IM SUSP
0.5000 mL | Freq: Once | INTRAMUSCULAR | Status: AC
  Administered 2015-05-10: 0.5 mL via INTRAMUSCULAR
  Filled 2015-05-10: qty 0.5

## 2015-05-10 NOTE — Discharge Instructions (Signed)
General Assault °Assault includes any behavior or physical attack--whether it is on purpose or not--that results in injury to another person, damage to property, or both. This also includes assault that has not yet happened, but is planned to happen. Threats of assault may be physical, verbal, or written. They may be said or sent by: °· Mail. °· E-mail. °· Text. °· Social media. °· Fax. °The threats may be direct, implied, or understood. °WHAT ARE THE DIFFERENT FORMS OF ASSAULT? °Forms of assault include: °· Physically assaulting a person. This includes physical threats to inflict physical harm as well as: °¨ Slapping. °¨ Hitting. °¨ Poking. °¨ Kicking. °¨ Punching. °¨ Pushing. °· Sexually assaulting a person. Sexual assault is any sexual activity that a person is forced, threatened, or coerced to participate in. It may or may not involve physical contact with the person who is assaulting you. You are sexually assaulted if you are forced to have sexual contact of any kind. °· Damaging or destroying a person's assistive equipment, such as glasses, canes, or walkers. °· Throwing or hitting objects. °· Using or displaying a weapon to harm or threaten someone. °· Using or displaying an object that appears to be a weapon in a threatening manner. °· Using greater physical size or strength to intimidate someone. °· Making intimidating or threatening gestures. °· Bullying. °· Hazing. °· Using language that is intimidating, threatening, hostile, or abusive. °· Stalking. °· Restraining someone with force. °WHAT SHOULD I DO IF I EXPERIENCE ASSAULT? °· Report assaults, threats, and stalking to the police. Call your local emergency services (911 in the [place] if you are in immediate danger or you need medical help.  °· You can work with a lawyer or an advocate to get legal protection against someone who has assaulted you or threatened you with assault. Protection includes restraining orders and private addresses. Crimes against  you, such as assault, can also be prosecuted through the courts. Laws will vary depending on where you live. °  °This information is not intended to replace advice given to you by your health care provider. Make sure you discuss any questions you have with your health care provider. °  °Document Released: 12/31/2004 Document Revised: 01/21/2014 Document Reviewed: 09/17/2013 °Elsevier Interactive Patient Education ©2016 [employer] °Facial or Scalp Contusion °A facial or scalp contusion is a deep bruise on the face or head. Injuries to the face and head generally cause a lot of swelling, especially around the eyes. Contusions are the result of an injury that caused bleeding under the skin. The contusion may turn blue, purple, or yellow. Minor injuries will give you a painless contusion, but more severe contusions may stay painful and swollen for a few weeks.  °CAUSES  °A facial or scalp contusion is caused by a blunt injury or trauma to the face or head area.  °SIGNS AND SYMPTOMS  °· Swelling of the injured area.   °· Discoloration of the injured area.   °· Tenderness, soreness, or pain in the injured area.   °DIAGNOSIS  °The diagnosis can be made by taking a medical history and doing a physical exam. An X-ray exam, CT scan, or MRI may be needed to determine if there are any associated injuries, such as broken bones (fractures). °TREATMENT  °Often, the best treatment for a facial or scalp contusion is applying cold compresses to the injured area. Over-the-counter medicines may also be recommended for pain control.  °HOME CARE INSTRUCTIONS  °· Only take over-the-counter or prescription medicines as directed by   your health care provider.   °· Apply ice to the injured area.   °¨ Put ice in a plastic bag.   °¨ Place a towel between your skin and the bag.   °¨ Leave the ice on for 20 minutes, 2-3 times a day.   °SEEK MEDICAL CARE IF: °· You have bite problems.   °· You have pain with chewing.   °· You are concerned  about facial defects. °SEEK IMMEDIATE MEDICAL CARE IF: °· You have severe pain or a headache that is not relieved by medicine.   °· You have unusual sleepiness, confusion, or personality changes.   °· You throw up (vomit).   °· You have a persistent nosebleed.   °· You have double vision or blurred vision.   °· You have fluid drainage from your nose or ear.   °· You have difficulty walking or using your arms or legs.   °MAKE SURE YOU:  °· Understand these instructions. °· Will watch your condition. °· Will get help right away if you are not doing well or get worse. °  °This information is not intended to replace advice given to you by your health care provider. Make sure you discuss any questions you have with your health care provider. °  °Document Released: 02/08/2004 Document Revised: 01/21/2014 Document Reviewed: 08/13/2012 °Elsevier Interactive Patient Education ©2016 [employer] ° °
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
## 2015-09-04 ENCOUNTER — Encounter: Payer: Self-pay | Admitting: *Deleted

## 2017-03-01 ENCOUNTER — Other Ambulatory Visit: Payer: Self-pay

## 2017-03-01 ENCOUNTER — Encounter (HOSPITAL_COMMUNITY): Payer: Self-pay | Admitting: Emergency Medicine

## 2017-03-01 DIAGNOSIS — W450XXA Nail entering through skin, initial encounter: Secondary | ICD-10-CM | POA: Insufficient documentation

## 2017-03-01 DIAGNOSIS — Y9301 Activity, walking, marching and hiking: Secondary | ICD-10-CM | POA: Insufficient documentation

## 2017-03-01 DIAGNOSIS — Z87891 Personal history of nicotine dependence: Secondary | ICD-10-CM | POA: Insufficient documentation

## 2017-03-01 DIAGNOSIS — N341 Nonspecific urethritis: Secondary | ICD-10-CM | POA: Insufficient documentation

## 2017-03-01 DIAGNOSIS — S91332A Puncture wound without foreign body, left foot, initial encounter: Secondary | ICD-10-CM | POA: Insufficient documentation

## 2017-03-01 DIAGNOSIS — Y929 Unspecified place or not applicable: Secondary | ICD-10-CM | POA: Insufficient documentation

## 2017-03-01 DIAGNOSIS — Y999 Unspecified external cause status: Secondary | ICD-10-CM | POA: Insufficient documentation

## 2017-03-01 NOTE — ED Triage Notes (Signed)
Pt states he is having some burning sensation with urination and a crust on his penis that he will like to be check for, pt also step on a rusted nail today on his left foot that want to be check as well. No fever or chills.

## 2017-03-02 ENCOUNTER — Emergency Department (HOSPITAL_COMMUNITY)
Admission: EM | Admit: 2017-03-02 | Discharge: 2017-03-02 | Disposition: A | Discharge status: Home / Self Care | Payer: Self-pay | Attending: Emergency Medicine | Admitting: Emergency Medicine

## 2017-03-02 DIAGNOSIS — N342 Other urethritis: Secondary | ICD-10-CM

## 2017-03-02 DIAGNOSIS — S91332A Puncture wound without foreign body, left foot, initial encounter: Secondary | ICD-10-CM

## 2017-03-02 LAB — URINALYSIS, ROUTINE W REFLEX MICROSCOPIC
BACTERIA UA: NONE SEEN
Bilirubin Urine: NEGATIVE
Glucose, UA: NEGATIVE mg/dL
Hgb urine dipstick: NEGATIVE
Ketones, ur: NEGATIVE mg/dL
Leukocytes, UA: NEGATIVE
Nitrite: NEGATIVE
Protein, ur: NEGATIVE mg/dL
RBC / HPF: NONE SEEN RBC/hpf (ref 0–5)
SPECIFIC GRAVITY, URINE: 1.001 — AB (ref 1.005–1.030)
SQUAMOUS EPITHELIAL / LPF: NONE SEEN
WBC, UA: NONE SEEN WBC/hpf (ref 0–5)
pH: 6 (ref 5.0–8.0)

## 2017-03-02 MED ORDER — METRONIDAZOLE 500 MG PO TABS
2000.0000 mg | ORAL_TABLET | Freq: Once | ORAL | Status: AC
  Administered 2017-03-02: 2000 mg via ORAL
  Filled 2017-03-02: qty 4

## 2017-03-02 MED ORDER — AZITHROMYCIN 250 MG PO TABS
1000.0000 mg | ORAL_TABLET | Freq: Once | ORAL | Status: AC
Start: 2017-03-02 — End: 2017-03-02
  Administered 2017-03-02: 1000 mg via ORAL
  Filled 2017-03-02: qty 4

## 2017-03-02 MED ORDER — CIPROFLOXACIN HCL 500 MG PO TABS: 500.0000 mg | ORAL_TABLET | Freq: Two times a day (BID) | ORAL | 0 refills | Status: AC

## 2017-03-02 MED ORDER — LIDOCAINE HCL (PF) 1 % IJ SOLN
INTRAMUSCULAR | Status: AC
  Administered 2017-03-02: 5 mL
  Filled 2017-03-02: qty 5

## 2017-03-02 MED ORDER — CEFTRIAXONE SODIUM 250 MG IJ SOLR
250.0000 mg | INTRAMUSCULAR | Status: DC
  Administered 2017-03-02: 250 mg via INTRAMUSCULAR
  Filled 2017-03-02: qty 250

## 2017-03-02 NOTE — ED Notes (Signed)
No response when called to room.  

## 2017-03-02 NOTE — Discharge Instructions (Signed)
Check with primary care, or health department if not improving. No unprotected intercourse.  Any exposed sexual partner should be tested and treated as well. Return to ER with any redness, or swelling of your foot

## 2017-03-04 LAB — GC/CHLAMYDIA PROBE AMP (~~LOC~~) NOT AT ARMC
Chlamydia: NEGATIVE
NEISSERIA GONORRHEA: NEGATIVE

## 2017-03-04 NOTE — ED Notes (Signed)
Pt. Called for results of STD, Results reviewed and all questions answered.

## 2017-03-13 NOTE — ED Provider Notes (Signed)
[facility] Provider Note   CSN: [id number] Arrival date & time: 03/01/17  2330     History   Chief Complaint Chief Complaint  Patient presents with  . Dysuria    HPI Joe Reeves is a 27 y.o. male.  Chief complaint is "crust on my penis and dysuria, stepped on a nail  HPI 27 year old male.  Is had some urethritis with some burning and crusting urethral discharge.  Symptoms for several days.  No scrotal pain.  No inguinal swelling.  Also stepped on a nail through some rubber soled tennis shoes today.  Minimal bleeding.  No pain.  Did not have to remove the nail forcibly.  States he as he was walking by felt it and it was instantly back out.   History reviewed. No pertinent past medical history.  There are no active problems to display for this patient.   Past Surgical History:  Procedure Laterality Date  . ANKLE SURGERY    . HERNIA REPAIR         Home Medications    Prior to Admission medications   Medication Sig Start Date End Date Taking? Authorizing Provider  amoxicillin (AMOXIL) 500 MG capsule Take 1 capsule (500 mg total) by mouth 3 (three) times daily. Patient not taking: Reported on 05/10/2015 07/14/14   Jaynie CrumbleKirichenko, Tatyana, PA-C  ciprofloxacin (CIPRO) 500 MG tablet Take 1 tablet (500 mg total) by mouth 2 (two) times daily. 03/02/17   Rolland PorterJames, Hernandez [place], MD  ibuprofen (ADVIL,MOTRIN) 800 MG tablet Take 1 tablet (800 mg total) by mouth 3 (three) times daily. 05/10/15   Elson AreasSofia, Leslie K, PA-C  oxyCODONE (ROXICODONE) 5 MG immediate release tablet Take 1 tablet (5 mg total) by mouth every 4 (four) hours as needed for severe pain. Patient not taking: Reported on 05/10/2015 03/06/14   Harle Battiestysinger, Elizabeth, NP    Family History No family history on file.  Social History Social History   Tobacco Use  . Smoking status: Former Smoker  Substance Use Topics  . Alcohol use: Yes  . Drug use: Yes    Types: Marijuana     Allergies     Tylenol [acetaminophen]   Review of Systems Review of Systems  Constitutional: Negative for appetite change, chills, diaphoresis, fatigue and fever.  HENT: Negative for mouth sores, sore throat and trouble swallowing.   Eyes: Negative for visual disturbance.  Respiratory: Negative for cough, chest tightness, shortness of breath and wheezing.   Cardiovascular: Negative for chest pain.  Gastrointestinal: Negative for abdominal distention, abdominal pain, diarrhea, nausea and vomiting.  Endocrine: Negative for polydipsia, polyphagia and polyuria.  Genitourinary: Positive for discharge and dysuria. Negative for frequency and hematuria.  Musculoskeletal: Negative for gait problem.       Foot pain and puncture.  Skin: Negative for color change, pallor and rash.  Neurological: Negative for dizziness, syncope, light-headedness and headaches.  Hematological: Does not bruise/bleed easily.  Psychiatric/Behavioral: Negative for behavioral problems and confusion.     Physical Exam Updated Vital Signs BP 124/68 (BP Location: Right Arm)   Pulse 78   Temp 98.3 F (36.8 C) (Oral)   Resp 18   Ht 5\' 10"  (1.778 m)   Wt 97.5 kg (215 lb)   SpO2 98%   BMI 30.85 kg/m   Physical Exam  Constitutional: He is oriented to person, place, and time. He appears well-developed and well-nourished. No distress.  HENT:  Head: Normocephalic.  Eyes: Conjunctivae are normal. Pupils are equal, round, and  reactive to light. No scleral icterus.  Neck: Normal range of motion. Neck supple. No thyromegaly present.  Cardiovascular: Normal rate and regular rhythm. Exam reveals no gallop and no friction rub.  No murmur heard. Pulmonary/Chest: Effort normal and breath sounds normal. No respiratory distress. He has no wheezes. He has no rales.  Abdominal: Soft. Bowel sounds are normal. He exhibits no distension. There is no tenderness. There is no rebound.  Genitourinary:  Genitourinary Comments: Normal appearance of  the penis.  Some discharge.  Musculoskeletal: Normal range of motion.  Any puncture of the foot.  Does not appear to be of traverse the foot to the dorsum.  No pain or swelling.  Neurological: He is alert and oriented to person, place, and time.  Skin: Skin is warm and dry. No rash noted.  Psychiatric: He has a normal mood and affect. His behavior is normal.     ED Treatments / Results  Labs (all labs ordered are listed, but only abnormal results are displayed) Labs Reviewed  URINALYSIS, ROUTINE W REFLEX MICROSCOPIC - Abnormal; Notable for the following components:      Result Value   Color, Urine COLORLESS (*)    Specific Gravity, Urine 1.001 (*)    All other components within normal limits  GC/CHLAMYDIA PROBE AMP ([place]) NOT AT [facility]    EKG  EKG Interpretation None       Radiology No results found.  Procedures Procedures (including critical care time)  Medications Ordered in ED Medications  azithromycin (ZITHROMAX) tablet 1,000 mg (1,000 mg Oral Given 03/02/17 0515)  metroNIDAZOLE (FLAGYL) tablet 2,000 mg (2,000 mg Oral Given 03/02/17 0515)  lidocaine (PF) (XYLOCAINE) 1 % injection (5 mLs  Given 03/02/17 0516)     Initial Impression / Assessment and Plan / ED Course  I have reviewed the triage vital signs and the nursing notes.  Pertinent labs & imaging results that were available during my care of the patient were reviewed by me and considered in my medical decision making (see chart for details).    GC chlamydia pending.  Plan treatment for urethritis.  And Rocephin, p.o. Zithromax, p.o. Flagyl.  5-day course twice daily Cipro for wound prophylaxis for his puncture wound of the foot covering Pseudomonas.  Final Clinical Impressions(s) / ED Diagnoses   Final diagnoses:  Urethritis  Puncture wound of left foot, initial encounter    ED Discharge Orders        Ordered    ciprofloxacin (CIPRO) 500 MG tablet  2 times daily     03/02/17 0458       Rolland Porter, MD 03/13/17 2201

## 2020-12-18 ENCOUNTER — Encounter (HOSPITAL_COMMUNITY): Payer: Self-pay

## 2020-12-18 ENCOUNTER — Other Ambulatory Visit: Payer: Self-pay

## 2020-12-18 ENCOUNTER — Ambulatory Visit (HOSPITAL_COMMUNITY)
Admission: EM | Admit: 2020-12-18 | Discharge: 2020-12-18 | Disposition: A | Discharge status: Home / Self Care | Payer: Self-pay | Attending: Emergency Medicine | Admitting: Emergency Medicine

## 2020-12-18 DIAGNOSIS — Z202 Contact with and (suspected) exposure to infections with a predominantly sexual mode of transmission: Secondary | ICD-10-CM

## 2020-12-18 DIAGNOSIS — Z113 Encounter for screening for infections with a predominantly sexual mode of transmission: Secondary | ICD-10-CM | POA: Insufficient documentation

## 2020-12-18 LAB — HIV ANTIBODY (ROUTINE TESTING W REFLEX): HIV Screen 4th Generation wRfx: NONREACTIVE

## 2020-12-18 NOTE — Discharge Instructions (Signed)
Labs pending 2-3 days, you will be contacted if positive for any sti and treatment will be sent to the pharmacy, you will have to return to the clinic if positive for syphilis to receive treatment   Please refrain from having sex until labs results, if positive please refrain from having sex until treatment complete and symptoms resolve   If positive for HIV, Syphilis,  please notify partner or partners so they may tested as well  Moving forward, it is recommended you use some form of protection against the transmission of sti infections  such as condoms or dental dams with each sexual encounter

## 2020-12-18 NOTE — ED Triage Notes (Signed)
Pt presents with c/o a possible exposure to HIV. States the condom broke during intercourse and is concerned his partner might have HIV.
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
## 2020-12-19 LAB — RPR: RPR Ser Ql: NONREACTIVE

## 2021-04-25 ENCOUNTER — Emergency Department (HOSPITAL_COMMUNITY)
Admission: EM | Admit: 2021-04-25 | Discharge: 2021-04-25 | Disposition: A | Discharge status: Home / Self Care | Payer: Self-pay | Attending: Emergency Medicine | Admitting: Emergency Medicine

## 2021-04-25 ENCOUNTER — Emergency Department (HOSPITAL_COMMUNITY): Payer: Self-pay

## 2021-04-25 ENCOUNTER — Encounter (HOSPITAL_COMMUNITY): Payer: Self-pay | Admitting: *Deleted

## 2021-04-25 ENCOUNTER — Other Ambulatory Visit: Payer: Self-pay

## 2021-04-25 DIAGNOSIS — S8391XA Sprain of unspecified site of right knee, initial encounter: Secondary | ICD-10-CM | POA: Insufficient documentation

## 2021-04-25 DIAGNOSIS — M25561 Pain in right knee: Secondary | ICD-10-CM | POA: Insufficient documentation

## 2021-04-25 DIAGNOSIS — X501XXA Overexertion from prolonged static or awkward postures, initial encounter: Secondary | ICD-10-CM | POA: Insufficient documentation

## 2021-04-25 DIAGNOSIS — S90811A Abrasion, right foot, initial encounter: Secondary | ICD-10-CM | POA: Insufficient documentation

## 2021-04-25 MED ORDER — NAPROXEN 250 MG PO TABS
500.0000 mg | ORAL_TABLET | Freq: Once | ORAL | Status: AC
Start: 2021-04-25 — End: 2021-04-25
  Administered 2021-04-25: 500 mg via ORAL
  Filled 2021-04-25: qty 2

## 2021-04-25 MED ORDER — NAPROXEN 375 MG PO TABS: 375.0000 mg | ORAL_TABLET | Freq: Two times a day (BID) | ORAL | 0 refills | Status: AC

## 2021-04-25 NOTE — ED Notes (Signed)
Called in lobby with no answer 

## 2021-04-25 NOTE — ED Provider Notes (Signed)
?[facility] ?Provider Note ? ? ?CSN: [id number] ?Arrival date & time: 04/25/21  1022 ? ?  ? ?History ? ?Chief Complaint  ?Patient presents with  ? Knee Pain  ? ? ?Joe Reeves is a 31 y.o. male. ? ?31 year old male presents today for evaluation of right knee pain and right foot pain onset 9 PM last night.  Patient reports he was trying to lift a stove when he felt his right knee pop and give way and he fell to the ground.  He was unable to lift the stove and the still did not fall on him.  He does have abrasion to his right knee and right foot.  He has not been able to bear weight since the incident.  He has previous crutches that he has been using.  He denies any other injuries.  He has taken Tylenol but nothing else. ? ?The history is provided by the patient. No language interpreter was used.  ? ?  ? ?Home Medications ?Prior to Admission medications   ?Medication Sig Start Date End Date Taking? Authorizing Provider  ?amoxicillin (AMOXIL) 500 MG capsule Take 1 capsule (500 mg total) by mouth 3 (three) times daily. ?Patient not taking: Reported on 05/10/2015 07/14/14   Jeannett Senior, PA-C  ?ciprofloxacin (CIPRO) 500 MG tablet Take 1 tablet (500 mg total) by mouth 2 (two) times daily. 03/02/17   Tanna Furry, MD  ?ibuprofen (ADVIL,MOTRIN) 800 MG tablet Take 1 tablet (800 mg total) by mouth 3 (three) times daily. 05/10/15   Fransico Meadow, PA-C  ?oxyCODONE (ROXICODONE) 5 MG immediate release tablet Take 1 tablet (5 mg total) by mouth every 4 (four) hours as needed for severe pain. ?Patient not taking: Reported on 05/10/2015 03/06/14   Britt Bottom, NP  ?   ? ?Allergies    ?Tylenol [acetaminophen]   ? ?Review of Systems   ?Review of Systems  ?Constitutional:  Negative for fever.  ?Musculoskeletal:  Positive for arthralgias, gait problem (Inability to bear weight on the right knee.) and joint swelling.  ?Skin:  Positive for wound (Abrasion to right knee and right foot).   ?Neurological:  Negative for weakness.  ?All other systems reviewed and are negative. ? ?Physical Exam ?Updated Vital Signs ?BP 137/81   Pulse 71   Temp 98.1 ?F (36.7 ?C) (Oral)   Resp 16   SpO2 96%  ?Physical Exam ?Vitals and nursing note reviewed.  ?Constitutional:   ?   General: He is not in acute distress. ?   Appearance: Normal appearance. He is not ill-appearing.  ?HENT:  ?   Head: Normocephalic and atraumatic.  ?   Nose: Nose normal.  ?Eyes:  ?   Conjunctiva/sclera: Conjunctivae normal.  ?Cardiovascular:  ?   Rate and Rhythm: Normal rate and regular rhythm.  ?Pulmonary:  ?   Effort: Pulmonary effort is normal. No respiratory distress.  ?Musculoskeletal:     ?   General: No deformity.  ?   Right lower leg: No edema.  ?   Left lower leg: No edema.  ?   Comments: Right knee with visible swelling and abrasion to the lateral right knee.  Tenderness to palpation present over the lateral right knee.  Patient has flexion of the right knee to about 80 degrees.  He is unable to fully extend, but unable to hyperextend.  Anterior, posterior drawer test without laxity lateral valgus force, medial valgus force without laxity.  Right tib-fib without tenderness to palpation.  Right  hip without tenderness to palpation.  Full range of motion in the right hip.  Right ankle with full range of motion.  Forefoot of the right foot with tenderness to palpation over the medial aspect.  All toes of the right foot have superficial mild abrasion.  Full range of motion of all digits of the right foot present.  2+ DP pulse present and symmetrical.  Brisk cap refill.  ?Skin: ?   Findings: No rash.  ?Neurological:  ?   Mental Status: He is alert.  ? ? ?ED Results / Procedures / Treatments   ?Labs ?(all labs ordered are listed, but only abnormal results are displayed) ?Labs Reviewed - No data to display ? ?EKG ?None ? ?Radiology ?DG Knee Complete 4 Views Right ? ?Result Date: 04/25/2021 ?CLINICAL DATA:  fall, injury EXAM: RIGHT KNEE -  COMPLETE 4+ VIEW COMPARISON:  None. FINDINGS: Normal alignment. No acute fracture. Normal mineralization. The soft tissues are unremarkable. Moderate knee joint effusion. IMPRESSION: Moderate knee joint effusion.  No malalignment or acute fracture. Electronically Signed   By: Albin Felling M.D.   On: 04/25/2021 14:53   ? ?Procedures ?Procedures  ? ? ?Medications Ordered in ED ?Medications  ?naproxen (NAPROSYN) tablet 500 mg (500 mg Oral Given 04/25/21 1649)  ? ? ?ED Course/ Medical Decision Making/ A&P ?  ?                        ?Medical Decision Making ?Amount and/or Complexity of Data Reviewed ?Radiology: ordered. ? ?Risk ?Prescription drug management. ? ? ?31 year old male presents with injury to his right knee and right foot.  Right knee with visible swelling.  Exam reassuring and without laxity.  Neurovascularly intact.  Right knee x-ray without acute findings.  He does have moderate joint effusion but no acute fracture.  We will add on right foot x-ray.  Will provide naproxen and ice in the meantime. ?Foot x-ray negative for acute findings.  Patient is appropriate for discharge.  Discharged in stable condition.  Will provide knee immobilizer.  Will provide follow-up with orthopedist.  Plan discussed with patient.  He voices understanding and is in agreement with plan. ? ? ?Final Clinical Impression(s) / ED Diagnoses ?Final diagnoses:  ?Sprain of right knee, unspecified ligament, initial encounter  ? ? ?Rx / DC Orders ?ED Discharge Orders   ? ?      Ordered  ?  naproxen (NAPROSYN) 375 MG tablet  2 times daily       ? 04/25/21 1701  ? ?  ?  ? ?  ? ? ?  ?Evlyn Courier, PA-C ?04/25/21 1702 ? ?  ?Blanchie Dessert, MD ?04/28/21 2350 ? ?

## 2021-04-25 NOTE — Discharge Instructions (Addendum)
Your work-up today showed no fracture of your right knee or right foot.  You most likely have a sprain of your right knee.  I have attached follow-up information with an orthopedist above for you.  Please give their office a call and schedule a follow-up appointment.  Continue using your crutches and bear weight as you can tolerate.  We have also given you a knee immobilizer.  I recommend icing the knee for 15 to 20 minutes every 3-4 hours.  If you have any worsening symptoms you can return to the emergency room otherwise follow-up with the orthopedist. ?

## 2021-04-25 NOTE — ED Provider Triage Note (Signed)
Emergency Medicine Provider Triage Evaluation Note ? ?Joe Reeves , a 31 y.o. male  was evaluated in triage.  Pt complains of right knee pain. ? ?Review of Systems  ?Positive: Right knee pain ?Negative: numbness ? ?Physical Exam  ?BP (!) 151/81 (BP Location: Right Arm)   Pulse 77   Temp 98.1 ?F (36.7 ?C) (Oral)   Resp 17   SpO2 100%  ?Gen:   Awake, no distress   ?Resp:  Normal effort  ?MSK:   Moves extremities without difficulty  ?Other:   ? ?Medical Decision Making  ?Medically screening exam initiated at 2:00 PM.  Appropriate orders placed.  Joe Reeves was informed that the remainder of the evaluation will be completed by another provider, this initial triage assessment does not replace that evaluation, and the importance of remaining in the ED until their evaluation is complete. ? ?Fell yesterday and landed on R knee.  Increasing pain with movement.  Has abrasion, is UTD with tdap. ?  ?Fayrene Helper, PA-C ?04/25/21 1401 ? ?

## 2021-04-25 NOTE — ED Notes (Signed)
Called pt x3 for triage, no response. Moving pt OTF. 

## 2021-04-25 NOTE — ED Triage Notes (Signed)
Pt reports heavy lifting last night and now having right knee pain. ?
